# Patient Record
Sex: Female | Born: 1955 | Race: White | Hispanic: No | Marital: Married | Smoking: Never smoker
Health system: Southern US, Community
[De-identification: ages and names within clinical notes are randomized; demographics above are authoritative.]

---

## 2015-03-22 ENCOUNTER — Emergency Department (HOSPITAL_BASED_OUTPATIENT_CLINIC_OR_DEPARTMENT_OTHER): Payer: BLUE CROSS/BLUE SHIELD

## 2015-03-22 ENCOUNTER — Emergency Department (HOSPITAL_BASED_OUTPATIENT_CLINIC_OR_DEPARTMENT_OTHER)
Admission: EM | Admit: 2015-03-22 | Discharge: 2015-03-22 | Disposition: A | Discharge status: Other Acute Inpatient Hospital | Payer: BLUE CROSS/BLUE SHIELD | Attending: Emergency Medicine | Admitting: Emergency Medicine

## 2015-03-22 ENCOUNTER — Encounter (HOSPITAL_BASED_OUTPATIENT_CLINIC_OR_DEPARTMENT_OTHER): Payer: Self-pay | Admitting: *Deleted

## 2015-03-22 DIAGNOSIS — N12 Tubulo-interstitial nephritis, not specified as acute or chronic: Secondary | ICD-10-CM | POA: Diagnosis not present

## 2015-03-22 DIAGNOSIS — R1011 Right upper quadrant pain: Secondary | ICD-10-CM

## 2015-03-22 DIAGNOSIS — R000 Tachycardia, unspecified: Secondary | ICD-10-CM | POA: Diagnosis not present

## 2015-03-22 DIAGNOSIS — A419 Sepsis, unspecified organism: Secondary | ICD-10-CM

## 2015-03-22 DIAGNOSIS — J111 Influenza due to unidentified influenza virus with other respiratory manifestations: Secondary | ICD-10-CM | POA: Diagnosis not present

## 2015-03-22 DIAGNOSIS — R109 Unspecified abdominal pain: Secondary | ICD-10-CM | POA: Diagnosis present

## 2015-03-22 DIAGNOSIS — E860 Dehydration: Secondary | ICD-10-CM | POA: Diagnosis not present

## 2015-03-22 DIAGNOSIS — R6521 Severe sepsis with septic shock: Secondary | ICD-10-CM | POA: Insufficient documentation

## 2015-03-22 DIAGNOSIS — K529 Noninfective gastroenteritis and colitis, unspecified: Secondary | ICD-10-CM | POA: Diagnosis not present

## 2015-03-22 LAB — URINALYSIS, ROUTINE W REFLEX MICROSCOPIC
GLUCOSE, UA: NEGATIVE mg/dL
KETONES UR: 15 mg/dL — AB
Nitrite: NEGATIVE
Specific Gravity, Urine: 1.019 (ref 1.005–1.030)
pH: 5.5 (ref 5.0–8.0)

## 2015-03-22 LAB — CBC WITH DIFFERENTIAL/PLATELET
BASOS ABS: 0 10*3/uL (ref 0.0–0.1)
Basophils Relative: 0 %
Eosinophils Absolute: 0 10*3/uL (ref 0.0–0.7)
Eosinophils Relative: 0 %
HEMATOCRIT: 37.6 % (ref 36.0–46.0)
Hemoglobin: 12.8 g/dL (ref 12.0–15.0)
LYMPHS ABS: 0.2 10*3/uL — AB (ref 0.7–4.0)
LYMPHS PCT: 4 %
MCH: 29.8 pg (ref 26.0–34.0)
MCHC: 34 g/dL (ref 30.0–36.0)
MCV: 87.4 fL (ref 78.0–100.0)
MONO ABS: 0 10*3/uL — AB (ref 0.1–1.0)
Monocytes Relative: 1 %
NEUTROS ABS: 4.8 10*3/uL (ref 1.7–7.7)
Neutrophils Relative %: 95 %
Platelets: 171 10*3/uL (ref 150–400)
RBC: 4.3 MIL/uL (ref 3.87–5.11)
RDW: 13.8 % (ref 11.5–15.5)
WBC: 5 10*3/uL (ref 4.0–10.5)

## 2015-03-22 LAB — COMPREHENSIVE METABOLIC PANEL
ALT: 88 U/L — AB (ref 14–54)
AST: 108 U/L — AB (ref 15–41)
Albumin: 3.5 g/dL (ref 3.5–5.0)
Alkaline Phosphatase: 124 U/L (ref 38–126)
Anion gap: 10 (ref 5–15)
BILIRUBIN TOTAL: 1.3 mg/dL — AB (ref 0.3–1.2)
BUN: 26 mg/dL — AB (ref 6–20)
CALCIUM: 8.3 mg/dL — AB (ref 8.9–10.3)
CO2: 23 mmol/L (ref 22–32)
CREATININE: 1.75 mg/dL — AB (ref 0.44–1.00)
Chloride: 96 mmol/L — ABNORMAL LOW (ref 101–111)
GFR calc Af Amer: 36 mL/min — ABNORMAL LOW (ref >60)
GFR, EST NON AFRICAN AMERICAN: 31 mL/min — AB (ref >60)
Glucose, Bld: 150 mg/dL — ABNORMAL HIGH (ref 65–99)
Potassium: 3.6 mmol/L (ref 3.5–5.1)
Sodium: 129 mmol/L — ABNORMAL LOW (ref 135–145)
TOTAL PROTEIN: 6.4 g/dL — AB (ref 6.5–8.1)

## 2015-03-22 LAB — I-STAT CG4 LACTIC ACID, ED: Lactic Acid, Venous: 2.32 mmol/L (ref 0.5–2.0)

## 2015-03-22 LAB — URINE MICROSCOPIC-ADD ON

## 2015-03-22 LAB — RAPID STREP SCREEN (MED CTR MEBANE ONLY): Streptococcus, Group A Screen (Direct): NEGATIVE

## 2015-03-22 LAB — LIPASE, BLOOD: LIPASE: 24 U/L (ref 11–51)

## 2015-03-22 MED ORDER — SODIUM CHLORIDE 0.9 % IV BOLUS (SEPSIS)
1000.0000 mL | Freq: Once | INTRAVENOUS | Status: AC
  Administered 2015-03-22: 1000 mL via INTRAVENOUS

## 2015-03-22 MED ORDER — SODIUM CHLORIDE 0.9 % IV BOLUS (SEPSIS): 1000.0000 mL | Freq: Once | INTRAVENOUS | Status: DC

## 2015-03-22 MED ORDER — METRONIDAZOLE IN NACL 5-0.79 MG/ML-% IV SOLN
500.0000 mg | Freq: Once | INTRAVENOUS | Status: AC
  Administered 2015-03-22: 500 mg via INTRAVENOUS
  Filled 2015-03-22: qty 100

## 2015-03-22 MED ORDER — ONDANSETRON HCL 4 MG/2ML IJ SOLN: 4.0000 mg | Freq: Once | INTRAMUSCULAR | Status: DC

## 2015-03-22 MED ORDER — SODIUM CHLORIDE 0.9 % IV BOLUS (SEPSIS)
2000.0000 mL | Freq: Once | INTRAVENOUS | Status: AC
  Administered 2015-03-22: 1000 mL via INTRAVENOUS

## 2015-03-22 MED ORDER — ACETAMINOPHEN 500 MG PO TABS
1000.0000 mg | ORAL_TABLET | Freq: Once | ORAL | Status: AC
  Administered 2015-03-22: 1000 mg via ORAL
  Filled 2015-03-22: qty 2

## 2015-03-22 MED ORDER — DIPHENHYDRAMINE HCL 50 MG/ML IJ SOLN
12.5000 mg | Freq: Once | INTRAMUSCULAR | Status: AC
  Administered 2015-03-22: 12.5 mg via INTRAVENOUS
  Filled 2015-03-22: qty 1

## 2015-03-22 MED ORDER — DIPHENHYDRAMINE HCL 50 MG/ML IJ SOLN: 25.0000 mg | Freq: Once | INTRAMUSCULAR | Status: DC

## 2015-03-22 MED ORDER — METOCLOPRAMIDE HCL 5 MG/ML IJ SOLN
10.0000 mg | Freq: Once | INTRAMUSCULAR | Status: DC
  Filled 2015-03-22: qty 2

## 2015-03-22 MED ORDER — DEXTROSE 5 % IV SOLN
1.0000 g | Freq: Once | INTRAVENOUS | Status: AC
  Administered 2015-03-22: 1 g via INTRAVENOUS
  Filled 2015-03-22: qty 10

## 2015-03-22 NOTE — ED Notes (Signed)
BBS clr prior to hanging 4th liter.

## 2015-03-22 NOTE — ED Provider Notes (Signed)
CSN: [id number]     Arrival date & time 03/22/15  2952 History   First MD Initiated Contact with Patient 03/22/15 [phone]     Chief Complaint  Patient presents with  . Fever  . Emesis     (Consider location/radiation/quality/duration/timing/severity/associated sxs/prior Treatment) The history is provided by the patient.  Nicole Richards is a 60 y.o. female here presenting with vomiting, headaches, abdominal pain. Patient states that she was eating out with a friend 3 days ago. 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 She did not eat any seafood but then shortly afterwards, she started having numerous episodes of vomiting. Report about 10 episodes of vomiting over the next 24 hours. She called her primary care doctor and was prescribed promethazine which helped with the symptoms. She developed fever yesterday as well as headaches yesterday. Take any promethazine today and started vomiting again. She also is complaining of epigastric pain as well as low back pain. She denies any neck stiffness. Denies any rash or any recent travels.   History reviewed. No pertinent past medical history. Past Surgical History  Procedure Laterality Date  . Cesarean section     No family history on file. Social History  Substance Use Topics  . Smoking status: Never Smoker   . Smokeless tobacco: Never Used  . Alcohol Use: No   OB History    No data available     Review of Systems  Constitutional: Positive for fever.  Gastrointestinal: Positive for vomiting.  All other systems reviewed and are negative.     Allergies  Shellfish allergy  Home Medications   Prior to Admission medications   Not on File   BP 73/53 mmHg  Pulse 102  Temp(Src) 99.4 F (37.4 C) (Oral)  Resp 11  Ht 5\' 8"  (1.727 m)  Wt 159 lb (72.122 kg)  BMI 24.18 kg/m2  SpO2 98% Physical Exam  Constitutional: She is oriented to person, place, and time.  Uncomfortable,  dehydrated   HENT:  Head: Normocephalic.  Right Ear: External ear normal.  Left Ear: External ear normal.  MM slightly dry   Eyes: Conjunctivae are normal. Pupils are equal, round, and reactive to light.  Neck: Normal range of motion. Neck supple.  No meningeal signs. No cervical LAD  Cardiovascular: Regular rhythm and normal heart sounds.   Slightly tachy   Pulmonary/Chest: Effort normal and breath sounds normal. No respiratory distress. She has no wheezes. She has no rales.  Abdominal: Soft. Bowel sounds are normal.  + RUQ and epigastric tenderness. Mild bilateral flank tenderness   Musculoskeletal: Normal range of motion. She exhibits no edema or tenderness.  Neurological: She is alert and oriented to person, place, and time.  Skin: Skin is warm and dry.  Psychiatric: She has a normal mood and affect. Her behavior is normal. Judgment and thought content normal.  Nursing note and vitals reviewed.   ED Course  Procedures (including critical care time)  CRITICAL CARE Performed by: Silverio Lay, Estiven Kohan   Total critical care time:30  minutes  Critical care time was exclusive of separately billable procedures and treating other patients.  Critical care was necessary to treat or prevent imminent or life-threatening deterioration.  Critical care was time spent personally by me on the following activities: development of treatment plan with patient and/or surrogate as well as nursing, discussions with consultants, evaluation of patient's response to treatment, examination of patient, obtaining history from patient or surrogate,  ordering and performing treatments and interventions, ordering and review of laboratory studies, ordering and review of radiographic studies, pulse oximetry and re-evaluation of patient's condition.   Labs Review Labs Reviewed  CBC WITH DIFFERENTIAL/PLATELET - Abnormal; Notable for the following:    Lymphs Abs 0.2 (*)    Monocytes Absolute 0.0 (*)    All other  components within normal limits  COMPREHENSIVE METABOLIC PANEL - Abnormal; Notable for the following:    Sodium 129 (*)    Chloride 96 (*)    Glucose, Bld 150 (*)    BUN 26 (*)    Creatinine, Ser 1.75 (*)    Calcium 8.3 (*)    Total Protein 6.4 (*)    AST 108 (*)    ALT 88 (*)    Total Bilirubin 1.3 (*)    GFR calc non Af Amer 31 (*)    GFR calc Af Amer 36 (*)    All other components within normal limits  URINALYSIS, ROUTINE W REFLEX MICROSCOPIC (NOT AT [facility]) - Abnormal; Notable for the following:    Color, Urine AMBER (*)    APPearance TURBID (*)    Hgb urine dipstick LARGE (*)    Bilirubin Urine SMALL (*)    Ketones, ur 15 (*)    Protein, ur >300 (*)    Leukocytes, UA LARGE (*)    All other components within normal limits  URINE MICROSCOPIC-ADD ON - Abnormal; Notable for the following:    Squamous Epithelial / LPF 0-5 (*)    Bacteria, UA MANY (*)    All other components within normal limits  I-STAT CG4 LACTIC ACID, ED - Abnormal; Notable for the following:    Lactic Acid, Venous 2.32 (*)    All other components within normal limits  RAPID STREP SCREEN (NOT AT [facility])  CULTURE, BLOOD (ROUTINE X 2)  CULTURE, BLOOD (ROUTINE X 2)  CULTURE, GROUP A STREP [facility])  URINE CULTURE  LIPASE, BLOOD    Imaging Review Ct Abdomen Pelvis Wo Contrast  03/22/2015  CLINICAL DATA:  Persistent vomiting since Tuesday. Fever. Back pain. EXAM: CT ABDOMEN AND PELVIS WITHOUT CONTRAST TECHNIQUE: Multidetector CT imaging of the abdomen and pelvis was performed following the standard protocol without IV contrast. COMPARISON:  Renal ultrasound - earlier same day FINDINGS: The lack of intravenous contrast limits the ability to evaluate solid abdominal organs. There is a punctate (approximately 8 mm) nonobstructing stone within the inferior pole of the left kidney (coronal image 51, series 5). Note is also made of a punctate (approximately 7 mm) nonobstructing stone within the inferior pole of the right  kidney (coronal image 55, series 5). There is apparent mild left-sided pelvicaliectasis and ureterectasis involving the superior aspect the left ureter, the etiology of which is not depicted on this examination. There is a minimal amount of asymmetric left-sided perinephric stranding. No evidence of right-sided urinary obstruction. Normal noncontrast appearance of the urinary bladder given degree distention. Normal hepatic contour. Normal noncontrast appearance of the gallbladder. No radiopaque gallstones. No ascites. Normal noncontrast appearance of the bilateral adrenal glands, pancreas and spleen. There is apparent bowel wall thickening involving several loops of proximal jejunum within the left mid hemi abdomen (representative axial image 45, series 2; coronal image 39, series 5). Remaining loops of bowel appear normal in course and caliber without wall thickening. No evidence enteric obstruction. Normal noncontrast appearance of the terminal ileum and retrocecal appendix. No pneumoperitoneum, pneumatosis or portal venous gas. Normal caliber of the abdominal aorta. No bulky  retroperitoneal, mesenteric, pelvic or inguinal lymphadenopathy. Normal noncontrast appearance of the pelvic organs. No discrete adnexal lesion. No free fluid in the pelvic cul-de-sac. Limited visualization of the lower thorax demonstrates minimal dependent subpleural ground-glass atelectasis, left greater than right. No focal airspace opacities. No pleural effusion. Normal heart size. There is mild diffuse decreased attenuation of the intra cardiac blood pool suggestive of anemia. Trace amount of pericardial fluid, presumably physiologic. No acute or aggressive osseous abnormalities. Regional soft tissues appear normal. IMPRESSION: 1. Mild apparent bowel wall thickening involving several loops of proximal jejunum, not resulting in enteric obstruction. Differential considerations are broad and include both infectious and inflammatory  etiologies. 2. Mild left-sided pelvicaliectasis and ureterectasis the etiology of which is not depicted on this examination. While this may represent the sequela of a recently passed renal stone, underlying infection is not excluded and correlation with urinalysis is recommended. 3. Bilateral nonobstructing nephrolithiasis as above. Electronically Signed   By: Simonne Come M.D.   On: 03/22/2015 13:53   Ct Head Wo Contrast  03/22/2015  CLINICAL DATA:  Headache. EXAM: CT HEAD WITHOUT CONTRAST TECHNIQUE: Contiguous axial images were obtained from the base of the skull through the vertex without intravenous contrast. COMPARISON:  None. FINDINGS: Skull and Sinuses:Negative for fracture or destructive process. The visualized mastoids, middle ears, and imaged paranasal sinuses are clear. Visualized orbits: Negative. Brain: Normal appearance. No evidence of acute infarction, hemorrhage, hydrocephalus, or mass lesion/mass effect. IMPRESSION: Normal head CT. Electronically Signed   By: Marnee Spring M.D.   On: 03/22/2015 09:48   US Renal  03/22/2015  CLINICAL DATA:  LEFT RENAL STONE X-RAY. EXAM: RENAL / URINARY TRACT ULTRASOUND COMPLETE COMPARISON:  RIGHT UPPER QUADRANT ULTRASOUND FROM 2 HOURS EARLIER. FINDINGS: Right Kidney: Length: 11 cm. 8 mm echogenic focus in the lower pole suggests the presence of the stone. No hydronephrosis. Left Kidney: Length: 11.6 cm. 10 mm echogenic focus lower pole left kidney suggest the presence of a stone. Echogenicity within normal limits. No mass or hydronephrosis visualized. Bladder: Appears normal for degree of bladder distention. Note: The sonographer visualize the gallbladder is part of this study and found that the wall along the gallbladder fossa was thickened and appeared striated/edematous, measuring up to 5-6 mm diameter. This was a new finding when compared to the study from several hours earlier. IMPRESSION: 1. Probable nonobstructing bilateral renal stones. 2. Interval  development of gallbladder wall thickening. The abnormal wall does not appear to be circumferential and sonographer reported no sonographic Murphy sign although the patient has apparently had pain medicine. Imaging features raise concern for evolving acute cholecystitis. If the clinical picture is equivocal for acute cholecystitis, consider nuclear scintigraphy to assess for cystic duct occlusion. Electronically Signed   By: Kennith Center M.D.   On: 03/22/2015 12:32   Dg Abd Acute W/chest  03/22/2015  CLINICAL DATA:  Three-day history of nausea and vomiting EXAM: DG ABDOMEN ACUTE W/ 1V CHEST COMPARISON:  None. FINDINGS: PA chest: Lungs are clear. Heart size and pulmonary vascularity are normal. No adenopathy. Supine and upright abdomen: There is no appreciable bowel dilatation. No air-fluid levels. No free air. There is a calcification on the left in the region of the left kidney measuring 8 x 7 mm. IMPRESSION: Probable left renal calculus measuring 8 x 7 mm. No obstruction or free air evident. No lung edema or consolidation. Electronically Signed   By: Bretta Bang III M.D.   On: 03/22/2015 09:50   US Abdomen Limited Ruq  03/22/2015  CLINICAL DATA:  Nausea, vomiting and diarrhea. Generalized abdominal pain for the past 2 days. Fever. EXAM: US ABDOMEN LIMITED - RIGHT UPPER QUADRANT COMPARISON:  None. FINDINGS: Gallbladder: Sonographically normal. No echogenic gallstones or gall sludge. No gallbladder wall thickening or pericholecystic fluid. Negative sonographic Murphy's sign. Common bile duct: Diameter: Normal in size measuring 2 mm in diameter Liver: Homogeneous hepatic echotexture. No discrete hepatic lesions. No definite evidence of intrahepatic biliary ductal dilatation. No ascites. IMPRESSION: No explanation for patient's abdominal pain, nausea and vomiting. Specifically, no evidence of cholelithiasis or cholecystitis. Electronically Signed   By: Simonne Come M.D.   On: 03/22/2015 10:06   I have  personally reviewed and evaluated these images and lab results as part of my medical decision-making.   EKG Interpretation None      MDM   Final diagnoses:  RUQ pain    Nicole Richards is a 60 y.o. female here with fever, headaches, abdominal pain, flank pain. DDx is broad. Consider gastro vs pyelo vs acute chole vs pancreatitis vs infected kidney stone vs pneumonia vs flu. She has no meningeal signs so I doubt meningitis. Will get labs, cultures, UA, CXR, RUQ [place].   10 AM Patient's US showed no obvious acute chole. Xray showed bilateral intra renal stones. BP now in the upper 80s. Given 2 L NS bolus. Will give another bolus and add on Renal US.   12:30 pm Renal US showed no hydro but has bilateral intra renal stones. WBC nl, Cr 1.7. LFTs slightly elevated. Of note, renal US captured part of gallbladder and now its more contracted but there is no gallstones. Will get CT ab/pel to confirm.   2:02 PM CT showed some enteritis, no SBO. BP now in the 70s, still mentating well. Lactate 2.3. WBC nl. In and out cath showed + numerous WBC in the urine. Likely pyelo vs infected kidney stones. On 4th NS bolus now. Consulted Dr. Ronney Lion from ICU at [facility]. Given rocephin already. Will add flagyl to cover for enteritis. Will admit for septic shock from pyelo vs infected stones vs flu. Patient has no hydro so will not likely need ureteral stent.     Richardean Canal, MD 03/22/15 [phone]

## 2015-03-22 NOTE — ED Notes (Signed)
Pt reports she felt bad Tuesday after possible exposure to seafood. Multiple episodes of vomiting. Fever yesterday. C/o headache and vomited x 1 today

## 2015-03-22 NOTE — ED Notes (Signed)
Called Dr. Adin Hector direct in ICU at [facility]

## 2015-03-22 NOTE — ED Notes (Signed)
[facility] is transferring patient to [facility], truck is headed this way.

## 2015-03-25 LAB — URINE CULTURE

## 2015-03-25 LAB — CULTURE, BLOOD (ROUTINE X 2)

## 2015-03-25 LAB — CULTURE, GROUP A STREP (THRC)

## 2015-03-26 ENCOUNTER — Telehealth (HOSPITAL_BASED_OUTPATIENT_CLINIC_OR_DEPARTMENT_OTHER): Payer: Self-pay | Admitting: Emergency Medicine

## 2015-03-26 NOTE — Telephone Encounter (Signed)
Post ED Visit - Positive Culture Follow-up  Culture report reviewed by antimicrobial stewardship pharmacist:   Enzo Bi, Pharm.D.  Celedonio Miyamoto, Pharm.D., BCPS  Garvin Fila, Pharm.D.  Georgina Pillion, Pharm.D., BCPS  [place], [address].D., BCPS, AAHIVP  Estella Husk, Pharm.D., BCPS, AAHIVP  Tennis Must, Pharm.D.  Rob Oswaldo Done, [place].D.  Positive blood and urine culture E. Coli Patient transferred to [facility] , has now been discharged  Berle Mull 03/26/2015, 9:20 AM

## 2017-03-30 IMAGING — CR DG ABDOMEN ACUTE W/ 1V CHEST
3 series · 3 of 3 positions shown · non-contrast
Comparison: None.

CLINICAL DATA: Three-day history of nausea and vomiting

EXAM:
DG ABDOMEN ACUTE W/ 1V CHEST

[w chest pa]
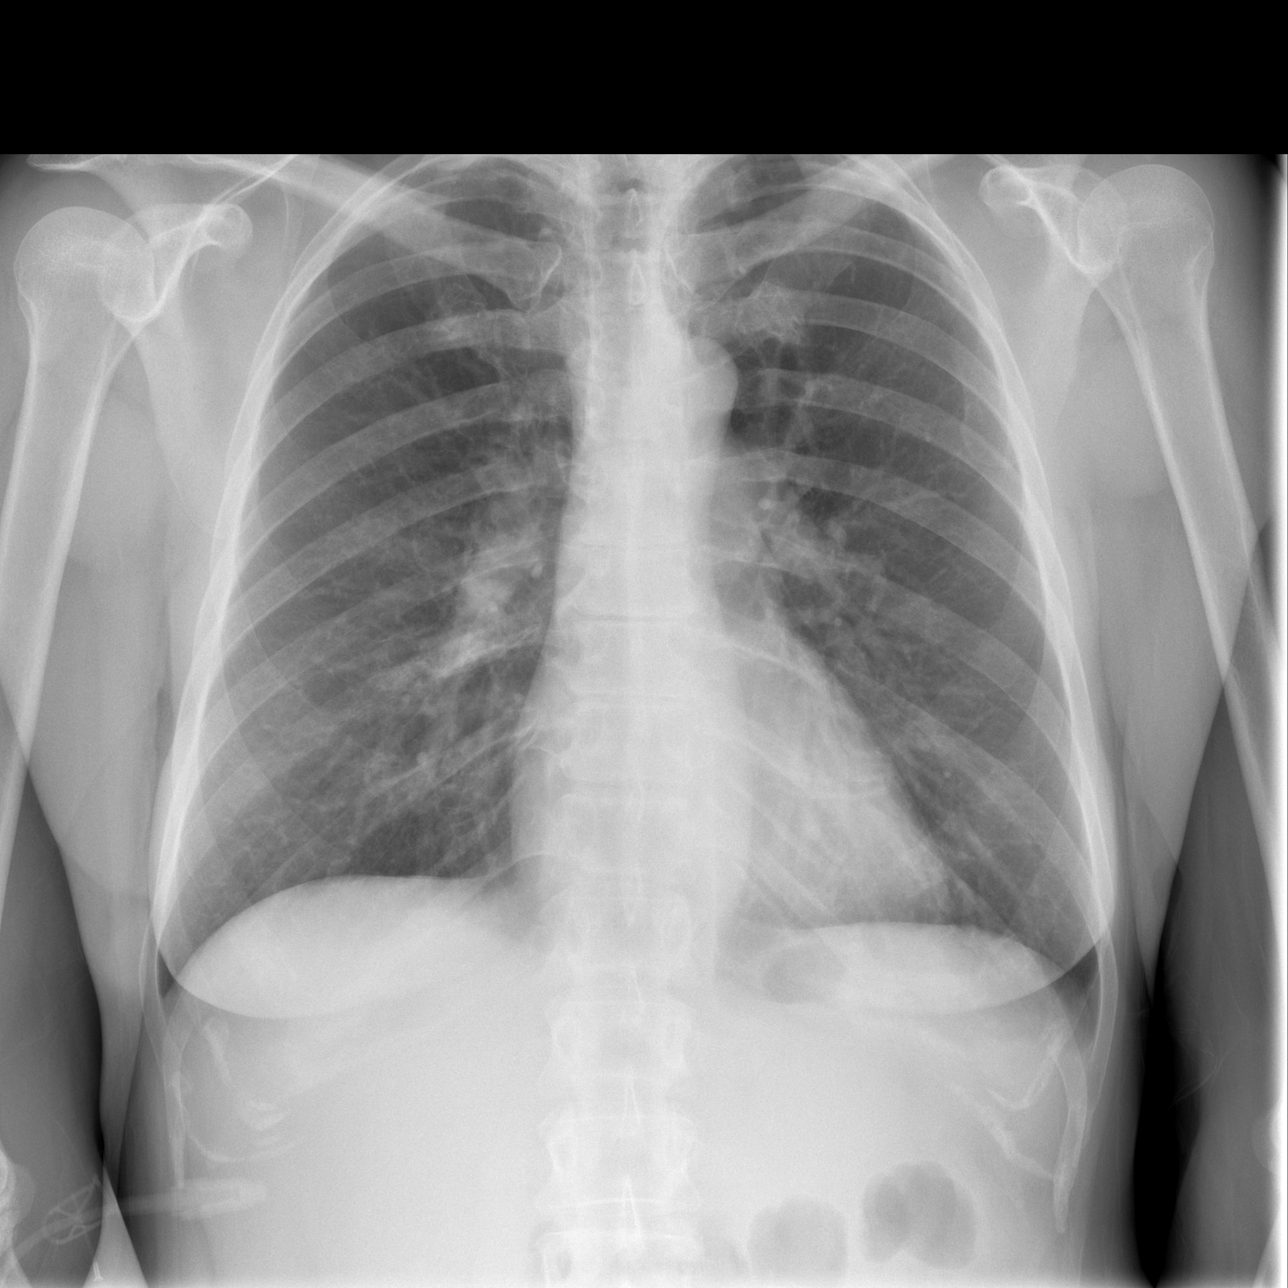

[w abdomen upright]
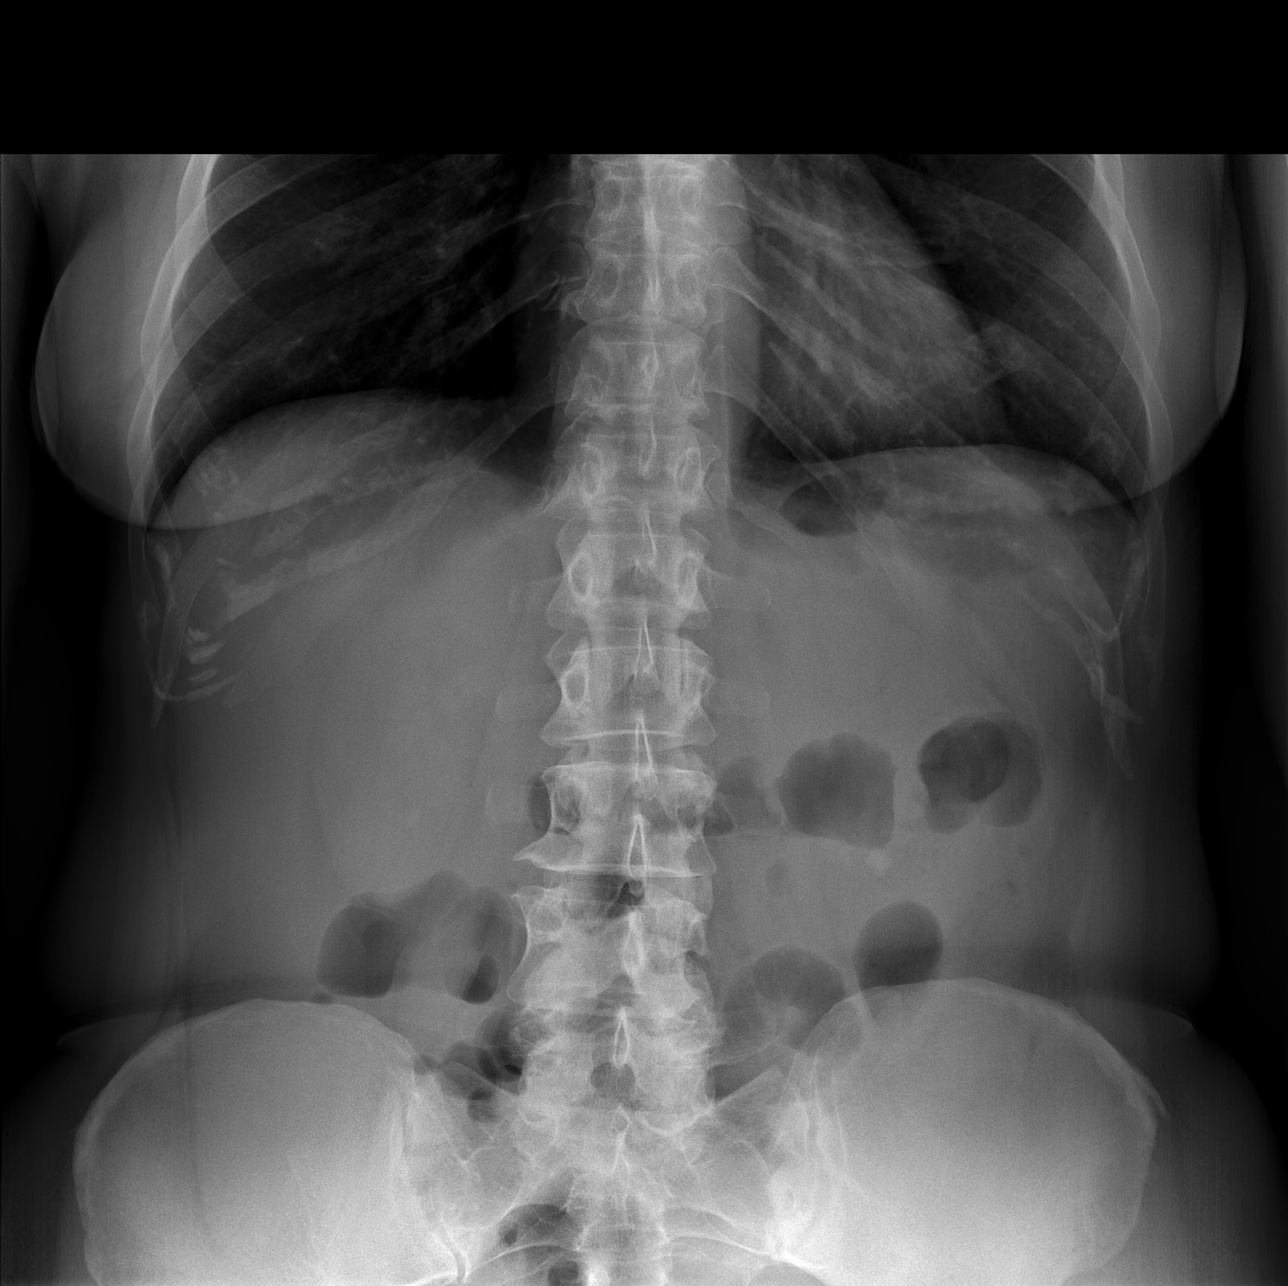

[t abdomen supine]
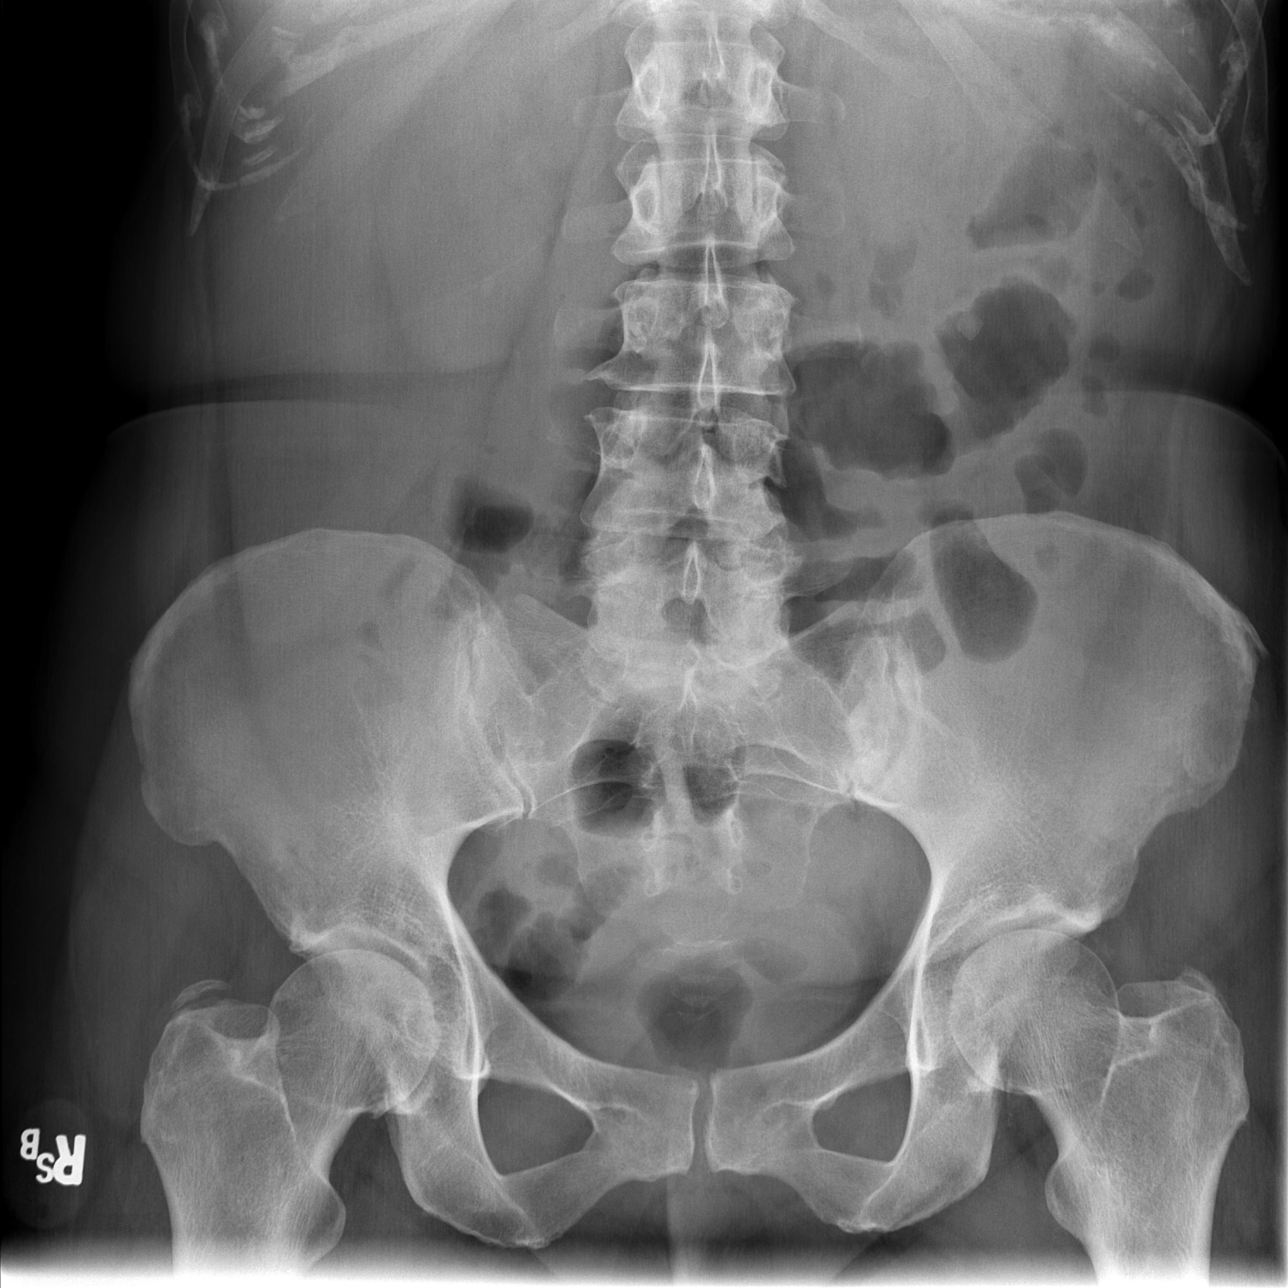

[3 of 3 positions shown; findings below may reference images not displayed]

FINDINGS: PA chest: Lungs are clear. Heart size and pulmonary vascularity are
normal. No adenopathy.

Supine and upright abdomen: There is no appreciable bowel
dilatation. No air-fluid levels. No free air. There is a
calcification on the left in the region of the left kidney measuring
8 x 7 mm.
IMPRESSION: Probable left renal calculus measuring 8 x 7 mm. No obstruction or
free air evident. No lung edema or consolidation.

## 2017-03-30 IMAGING — CT CT ABD-PELV W/O CM
2 of 4 series · 15 of 46 positions shown, 17 images · non-contrast
Comparison: Renal ultrasound - earlier same day

CLINICAL DATA: Persistent vomiting since [REDACTED]. Fever. Back pain.

EXAM:
CT ABDOMEN AND PELVIS WITHOUT CONTRAST
TECHNIQUE: Multidetector CT imaging of the abdomen and pelvis was performed
following the standard protocol without IV contrast.

[Series 2: axial st · axial · 0.97mm/px · z∈[-533,-113]mm · 12 of 100 slices shown, 14 images]
[im 8/100  soft-tissue]
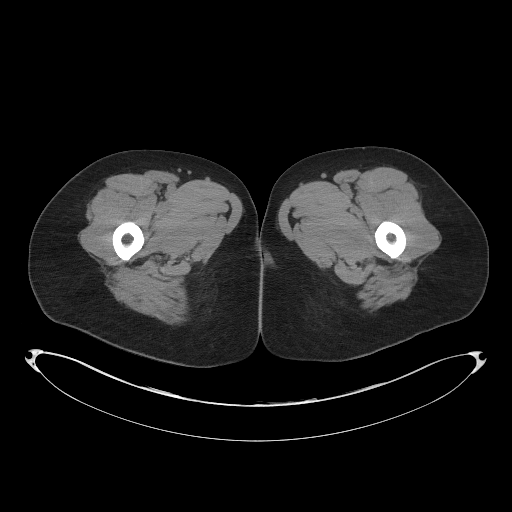
[im 8/100  bone]
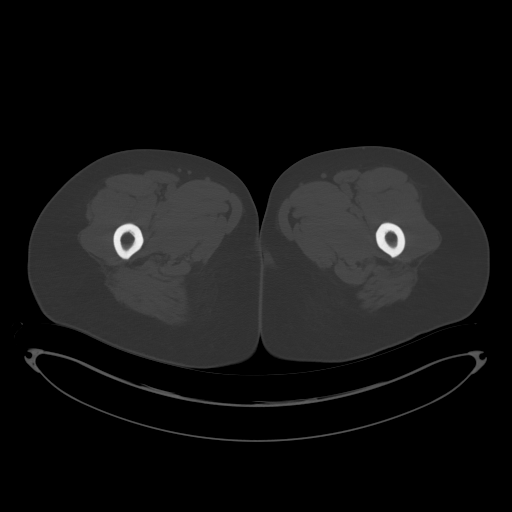
[im 16/100  soft-tissue]
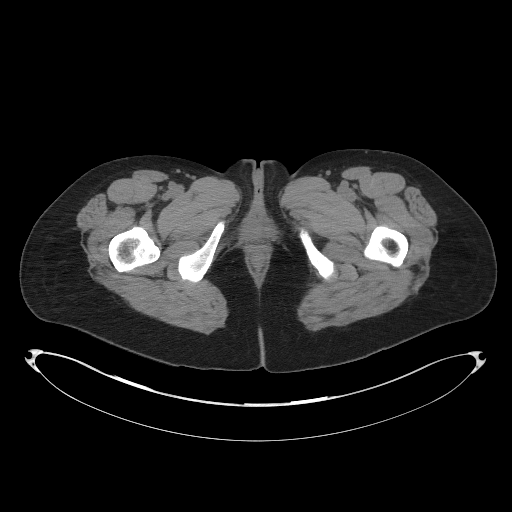
[im 23/100  soft-tissue]
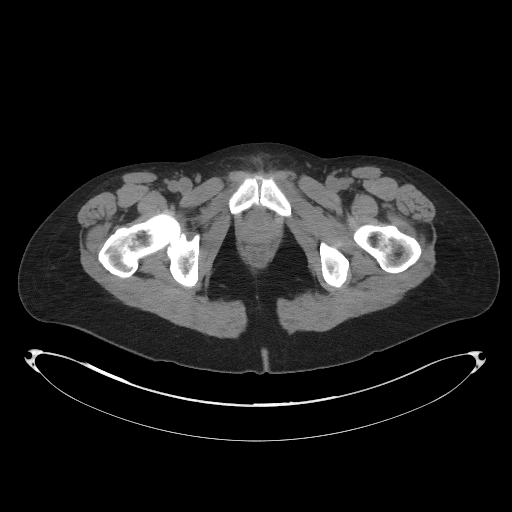
[im 31/100  soft-tissue]
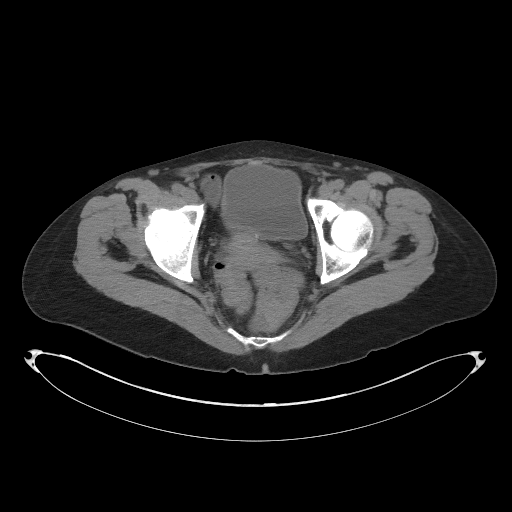
[im 39/100  soft-tissue]
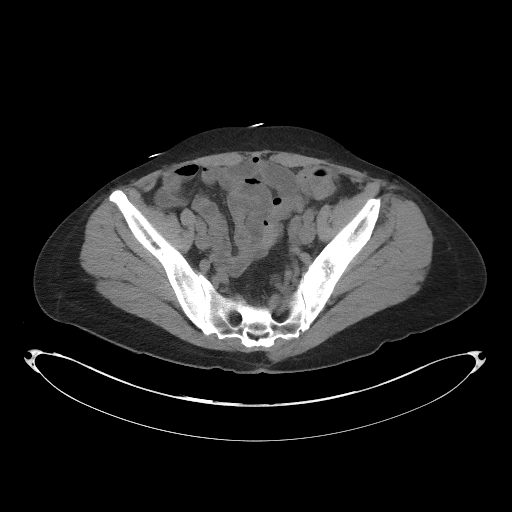
[im 46/100  soft-tissue]
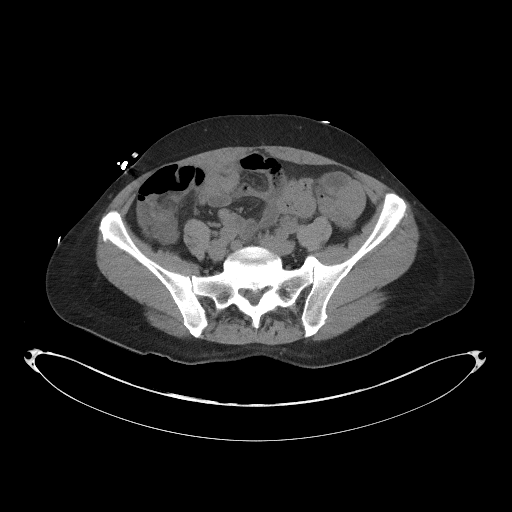
[im 54/100  soft-tissue]
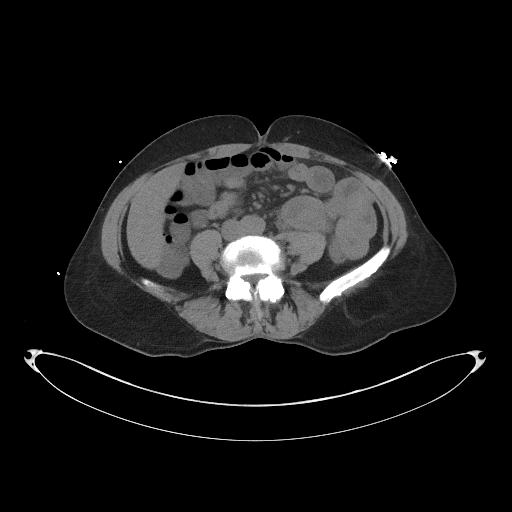
[im 61/100  soft-tissue]
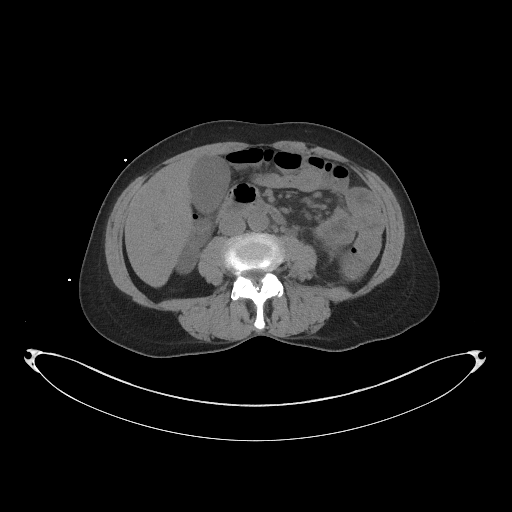
[im 69/100  soft-tissue]
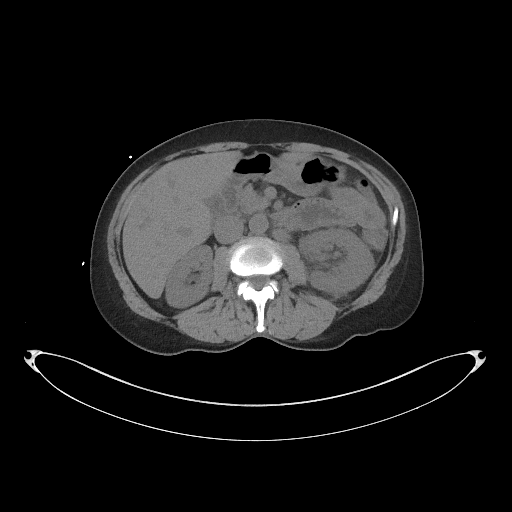
[im 69/100  bone]
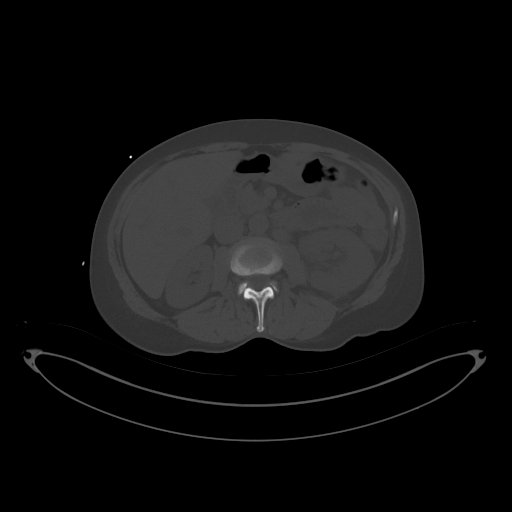
[im 77/100  soft-tissue]
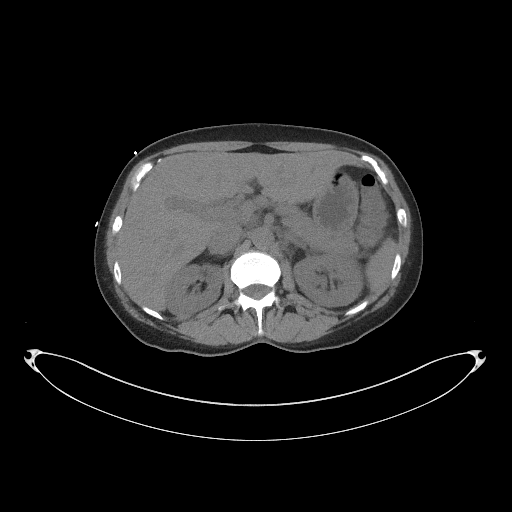
[im 84/100  soft-tissue]
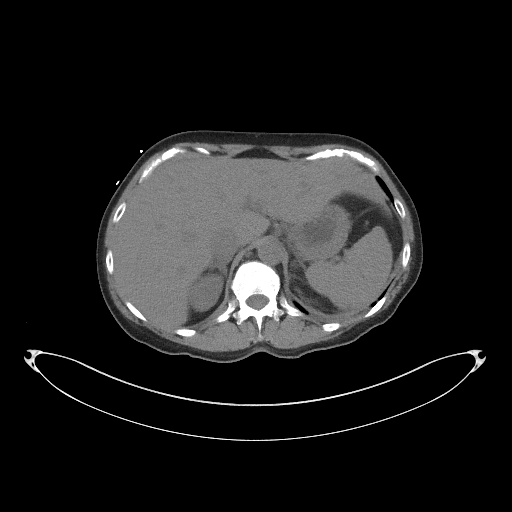
[im 92/100  soft-tissue]
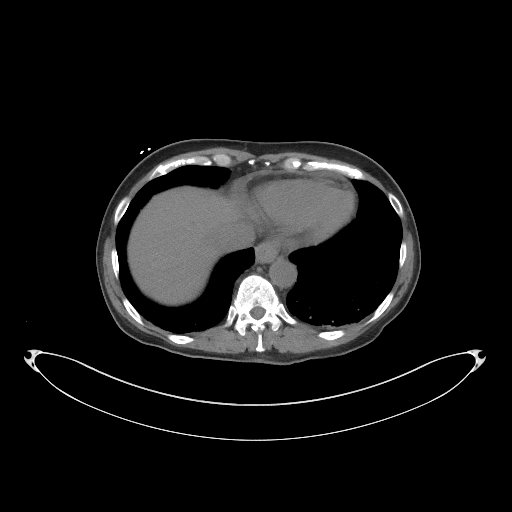

[Series 5: coronal st · coronal · 0.99mm/px · 3 of 91 slices shown]
[im 31/91  soft-tissue]
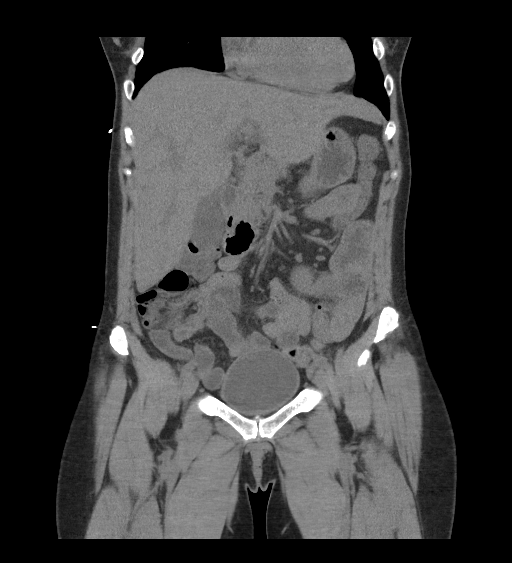
[im 41/91  soft-tissue]
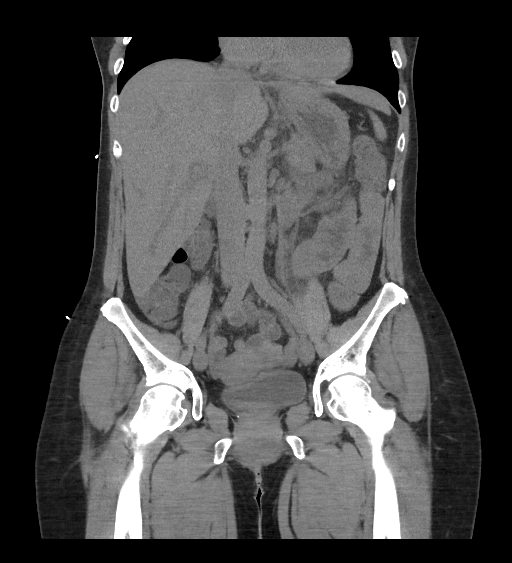
[im 51/91  soft-tissue]
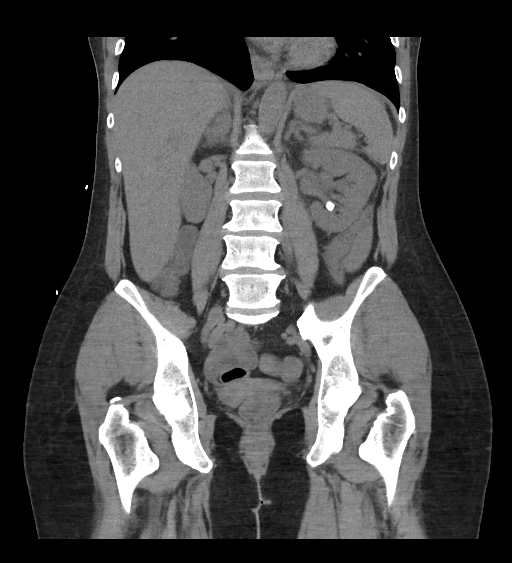

[15 of 46 positions shown; findings below may reference images not displayed]

FINDINGS: The lack of intravenous contrast limits the ability to evaluate
solid abdominal organs.

There is a punctate (approximately 8 mm) nonobstructing stone within
the inferior pole of the left kidney (coronal image 51, series 5).
Note is also made of a punctate (approximately 7 mm) nonobstructing
stone within the inferior pole of the right kidney (coronal image
55, series 5).

There is apparent mild left-sided pelvicaliectasis and ureterectasis
involving the superior aspect the left ureter, the etiology of which
is not depicted on this examination. There is a minimal amount of
asymmetric left-sided perinephric stranding. No evidence of
right-sided urinary obstruction.

Normal noncontrast appearance of the urinary bladder given degree
distention.

Normal hepatic contour. Normal noncontrast appearance of the
gallbladder. No radiopaque gallstones. No ascites.

Normal noncontrast appearance of the bilateral adrenal glands,
pancreas and spleen.

There is apparent bowel wall thickening involving several loops of
proximal jejunum within the left mid hemi abdomen (representative
axial image 45, series 2; coronal image 39, series 5). Remaining
loops of bowel appear normal in course and caliber without wall
thickening. No evidence enteric obstruction. Normal noncontrast
appearance of the terminal ileum and retrocecal appendix. No
pneumoperitoneum, pneumatosis or portal venous gas.

Normal caliber of the abdominal aorta.

No bulky retroperitoneal, mesenteric, pelvic or inguinal
lymphadenopathy.

Normal noncontrast appearance of the pelvic organs. No discrete
adnexal lesion. No free fluid in the pelvic cul-de-sac.

Limited visualization of the lower thorax demonstrates minimal
dependent subpleural ground-glass atelectasis, left greater than
right. No focal airspace opacities. No pleural effusion.

Normal heart size. There is mild diffuse decreased attenuation of
the intra cardiac blood pool suggestive of anemia. Trace amount of
pericardial fluid, presumably physiologic.

No acute or aggressive osseous abnormalities.

Regional soft tissues appear normal.
IMPRESSION: 1. Mild apparent bowel wall thickening involving several loops of
proximal jejunum, not resulting in enteric obstruction. Differential
considerations are broad and include both infectious and
inflammatory etiologies.
2. Mild left-sided pelvicaliectasis and ureterectasis the etiology
of which is not depicted on this examination. While this may
represent the sequela of a recently passed renal stone, underlying
infection is not excluded and correlation with urinalysis is
recommended.
3. Bilateral nonobstructing nephrolithiasis as above.
